# Patient Record
Sex: Male | Born: 1975
Health system: Southern US, Community
[De-identification: ages and names within clinical notes are randomized; demographics above are authoritative.]

---

## 1998-01-21 ENCOUNTER — Emergency Department (HOSPITAL_COMMUNITY): Admission: EM | Admit: 1998-01-21 | Discharge: 1998-01-21 | Payer: Self-pay | Admitting: Emergency Medicine

## 1998-10-29 ENCOUNTER — Emergency Department (HOSPITAL_COMMUNITY): Admission: EM | Admit: 1998-10-29 | Discharge: 1998-10-29 | Payer: Self-pay | Admitting: Emergency Medicine

## 1998-10-30 ENCOUNTER — Encounter: Payer: Self-pay | Admitting: Emergency Medicine

## 1999-05-09 ENCOUNTER — Encounter: Payer: Self-pay | Admitting: Emergency Medicine

## 1999-05-09 ENCOUNTER — Emergency Department (HOSPITAL_COMMUNITY): Admission: EM | Admit: 1999-05-09 | Discharge: 1999-05-10 | Payer: Self-pay | Admitting: Emergency Medicine

## 1999-09-01 ENCOUNTER — Emergency Department (HOSPITAL_COMMUNITY): Admission: EM | Admit: 1999-09-01 | Discharge: 1999-09-01 | Payer: Self-pay | Admitting: *Deleted

## 2006-09-18 ENCOUNTER — Emergency Department (HOSPITAL_COMMUNITY): Admission: EM | Admit: 2006-09-18 | Discharge: 2006-09-18 | Payer: Self-pay | Admitting: Emergency Medicine

## 2007-07-19 ENCOUNTER — Emergency Department (HOSPITAL_COMMUNITY): Admission: EM | Admit: 2007-07-19 | Discharge: 2007-07-19 | Payer: Self-pay | Admitting: Emergency Medicine

## 2007-12-28 ENCOUNTER — Emergency Department (HOSPITAL_BASED_OUTPATIENT_CLINIC_OR_DEPARTMENT_OTHER): Admission: EM | Admit: 2007-12-28 | Discharge: 2007-12-28 | Payer: Self-pay | Admitting: Emergency Medicine

## 2008-05-25 ENCOUNTER — Emergency Department (HOSPITAL_BASED_OUTPATIENT_CLINIC_OR_DEPARTMENT_OTHER): Admission: EM | Admit: 2008-05-25 | Discharge: 2008-05-25 | Payer: Self-pay | Admitting: Emergency Medicine

## 2008-05-25 ENCOUNTER — Ambulatory Visit: Payer: Self-pay | Admitting: Diagnostic Radiology

## 2008-06-21 ENCOUNTER — Emergency Department (HOSPITAL_BASED_OUTPATIENT_CLINIC_OR_DEPARTMENT_OTHER): Admission: EM | Admit: 2008-06-21 | Discharge: 2008-06-22 | Payer: Self-pay | Admitting: Emergency Medicine

## 2008-12-27 ENCOUNTER — Emergency Department (HOSPITAL_BASED_OUTPATIENT_CLINIC_OR_DEPARTMENT_OTHER): Admission: EM | Admit: 2008-12-27 | Discharge: 2008-12-27 | Payer: Self-pay | Admitting: Emergency Medicine

## 2009-02-12 ENCOUNTER — Emergency Department (HOSPITAL_BASED_OUTPATIENT_CLINIC_OR_DEPARTMENT_OTHER): Admission: EM | Admit: 2009-02-12 | Discharge: 2009-02-12 | Payer: Self-pay | Admitting: Emergency Medicine

## 2009-06-11 ENCOUNTER — Emergency Department (HOSPITAL_BASED_OUTPATIENT_CLINIC_OR_DEPARTMENT_OTHER): Admission: EM | Admit: 2009-06-11 | Discharge: 2009-06-12 | Payer: Self-pay | Admitting: Emergency Medicine

## 2009-06-12 ENCOUNTER — Ambulatory Visit: Payer: Self-pay | Admitting: Diagnostic Radiology

## 2010-01-18 ENCOUNTER — Emergency Department (HOSPITAL_BASED_OUTPATIENT_CLINIC_OR_DEPARTMENT_OTHER): Admission: EM | Admit: 2010-01-18 | Discharge: 2010-01-18 | Payer: Self-pay | Admitting: Emergency Medicine

## 2010-01-20 ENCOUNTER — Emergency Department (HOSPITAL_BASED_OUTPATIENT_CLINIC_OR_DEPARTMENT_OTHER): Admission: EM | Admit: 2010-01-20 | Discharge: 2010-01-20 | Payer: Self-pay | Admitting: Emergency Medicine

## 2010-04-09 ENCOUNTER — Emergency Department (HOSPITAL_BASED_OUTPATIENT_CLINIC_OR_DEPARTMENT_OTHER): Admission: EM | Admit: 2010-04-09 | Discharge: 2010-04-09 | Payer: Self-pay | Admitting: Emergency Medicine

## 2010-08-03 LAB — GC/CHLAMYDIA PROBE AMP, GENITAL
Chlamydia, DNA Probe: NEGATIVE
GC Probe Amp, Genital: NEGATIVE

## 2010-08-08 LAB — DIFFERENTIAL
Basophils Absolute: 0 10*3/uL (ref 0.0–0.1)
Basophils Relative: 1 % (ref 0–1)
Eosinophils Relative: 6 % — ABNORMAL HIGH (ref 0–5)
Lymphocytes Relative: 38 % (ref 12–46)

## 2010-08-08 LAB — CBC
HCT: 44.9 % (ref 39.0–52.0)
MCHC: 33.6 g/dL (ref 30.0–36.0)
Platelets: 243 10*3/uL (ref 150–400)
RDW: 11.9 % (ref 11.5–15.5)

## 2010-08-08 LAB — SEDIMENTATION RATE: Sed Rate: 9 mm/hr (ref 0–16)

## 2011-02-11 LAB — RAPID STREP SCREEN (MED CTR MEBANE ONLY): Streptococcus, Group A Screen (Direct): POSITIVE — AB

## 2012-03-09 ENCOUNTER — Encounter (HOSPITAL_BASED_OUTPATIENT_CLINIC_OR_DEPARTMENT_OTHER): Payer: Self-pay | Admitting: *Deleted

## 2012-03-09 ENCOUNTER — Emergency Department (HOSPITAL_BASED_OUTPATIENT_CLINIC_OR_DEPARTMENT_OTHER)
Admission: EM | Admit: 2012-03-09 | Discharge: 2012-03-09 | Disposition: A | Discharge status: Home / Self Care | Payer: Self-pay | Attending: Emergency Medicine | Admitting: Emergency Medicine

## 2012-03-09 DIAGNOSIS — H538 Other visual disturbances: Secondary | ICD-10-CM

## 2012-03-09 DIAGNOSIS — H16139 Photokeratitis, unspecified eye: Secondary | ICD-10-CM | POA: Insufficient documentation

## 2012-03-09 DIAGNOSIS — F17200 Nicotine dependence, unspecified, uncomplicated: Secondary | ICD-10-CM | POA: Insufficient documentation

## 2012-03-09 DIAGNOSIS — H5710 Ocular pain, unspecified eye: Secondary | ICD-10-CM

## 2012-03-09 MED ORDER — ERYTHROMYCIN 5 MG/GM OP OINT: TOPICAL_OINTMENT | OPHTHALMIC | Status: DC

## 2012-03-09 MED ORDER — FLUORESCEIN SODIUM 1 MG OP STRP
1.0000 | ORAL_STRIP | Freq: Once | OPHTHALMIC | Status: AC
  Administered 2012-03-09: 1 via OPHTHALMIC
  Filled 2012-03-09: qty 1

## 2012-03-09 MED ORDER — PROPARACAINE HCL 0.5 % OP SOLN
1.0000 [drp] | Freq: Once | OPHTHALMIC | Status: AC
  Administered 2012-03-09: 1 [drp] via OPHTHALMIC
  Filled 2012-03-09: qty 15

## 2012-03-09 NOTE — ED Provider Notes (Signed)
History     CSN: [id number]  Arrival date & time 03/09/12  4098   First MD Initiated Contact with Patient 03/09/12 1023      Chief Complaint  Patient presents with  . Eye Pain    (Consider location/radiation/quality/duration/timing/severity/associated sxs/prior treatment) HPISpencer A Hull is a 36 y.o. 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 He was working on her machine yesterday and applies a labile with ultraviolet light he said he had some difficulty with the safety devices. He said he was unable to fully shield his eyes and got flash a couple times with the UV light. He started having eye pain yesterday but it is worsened today, it is bilateral, it is worse in the right eye. He says his vision is slightly blurry, he says his eyes are tearing up and he feels like there something in his eyes. He tells me "I think it's welders eye." Denies any fevers, chills, headaches, does not have corrective vision. History reviewed. No pertinent past medical history.  History reviewed. No pertinent past surgical history.  History reviewed. No pertinent family history.  History  Substance Use Topics  . Smoking status: Current Every Day Smoker  . Smokeless tobacco: Never Used  . Alcohol Use: Yes     occ      Review of Systems At least 10pt or greater review of systems completed and are negative except where specified in the HPI.  Allergies  Review of patient's allergies indicates no known allergies.  Home Medications  No current outpatient prescriptions on file.  BP 143/88  Pulse 88  Temp 98.5 F (36.9 C) (Oral)  Resp 20  SpO2 99%  Physical Exam  Nursing notes reviewed.  Electronic medical record reviewed. VITAL SIGNS:   Filed Vitals:   03/09/12 1008  BP: 143/88  Pulse: 88  Temp: 98.5 F (36.9 C)  TempSrc: Oral  Resp: 20  SpO2: 99%   CONSTITUTIONAL: Awake, oriented, appears non-toxic HENT: Atraumatic, normocephalic, oral mucosa  pink and moist, airway patent. Nares patent without drainage. External ears normal. EYES: Ocular Exam General appearance:hazy, mildly injected conjunctiva, eyes squinting in ambient light Visual acuity OD: 20/50, OS:20/50, OU:20/30 No visual field deficits, EOMI, PERRLA 5mm Lids/Lashes:normal Anterior Chamber: No cell/flare, no hyphema, no hypopyon. No pain to iris contraction. Iris appears normal. Fluoroscein exam with cobalt blue filter reveals diffuse, punctate uptake throughout the cornea. NECK: Trachea midline, non-tender, supple CARDIOVASCULAR: Normal heart rate, Normal rhythm, No murmurs, rubs, gallops PULMONARY/CHEST: Clear to auscultation, no rhonchi, wheezes, or rales. Symmetrical breath sounds. Non-tender. ABDOMINAL: Non-distended, soft, non-tender - no rebound or guarding.  BS normal. NEUROLOGIC: Non-focal, moving all four extremities, no gross sensory or motor deficits. EXTREMITIES: No clubbing, cyanosis, or edema SKIN: Warm, Dry, No erythema, No rash  ED Course  Procedures (including critical care time)  Labs Reviewed - No data to display No results found.   1. UV keratitis   2. Eye pain   3. Blurry vision, bilateral       MDM  Jason Hull is a 36 y.o. male patient's pain was relieved by a drop of proparacaine bilaterally. On slit-lamp examination, patient has diffuse punctate keratitis likely result from the UV exposure yesterday. We'll prescribe the patient erythromycin ointment applied 4 times a day for comfort and to avoid bacterial superinfection. Patient to followup with ophthalmology as needed.  I explained the diagnosis and have given explicit precautions to return to the ER including worsening  eye pain, vision changes or any other new or worsening symptoms. The patient understands and accepts the medical plan as it's been dictated and I have answered their questions. Discharge instructions concerning home care and prescriptions have been given.  The patient  is STABLE and is discharged to home in good condition.       Jones Skene, MD 03/09/12 1543

## 2012-03-09 NOTE — ED Notes (Signed)
Bilateral eye pain and burning states the right one is worse. 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

## 2013-03-24 ENCOUNTER — Emergency Department (HOSPITAL_BASED_OUTPATIENT_CLINIC_OR_DEPARTMENT_OTHER): Payer: Self-pay

## 2013-03-24 ENCOUNTER — Emergency Department (HOSPITAL_BASED_OUTPATIENT_CLINIC_OR_DEPARTMENT_OTHER)
Admission: EM | Admit: 2013-03-24 | Discharge: 2013-03-24 | Disposition: A | Discharge status: Home / Self Care | Payer: Self-pay | Attending: Emergency Medicine | Admitting: Emergency Medicine

## 2013-03-24 ENCOUNTER — Encounter (HOSPITAL_BASED_OUTPATIENT_CLINIC_OR_DEPARTMENT_OTHER): Payer: Self-pay | Admitting: Emergency Medicine

## 2013-03-24 DIAGNOSIS — Z792 Long term (current) use of antibiotics: Secondary | ICD-10-CM | POA: Insufficient documentation

## 2013-03-24 DIAGNOSIS — K219 Gastro-esophageal reflux disease without esophagitis: Secondary | ICD-10-CM | POA: Insufficient documentation

## 2013-03-24 DIAGNOSIS — F17200 Nicotine dependence, unspecified, uncomplicated: Secondary | ICD-10-CM | POA: Insufficient documentation

## 2013-03-24 DIAGNOSIS — J159 Unspecified bacterial pneumonia: Secondary | ICD-10-CM | POA: Insufficient documentation

## 2013-03-24 DIAGNOSIS — J189 Pneumonia, unspecified organism: Secondary | ICD-10-CM

## 2013-03-24 MED ORDER — AZITHROMYCIN 250 MG PO TABS
500.0000 mg | ORAL_TABLET | Freq: Once | ORAL | Status: AC
  Administered 2013-03-24: 500 mg via ORAL
  Filled 2013-03-24: qty 2

## 2013-03-24 MED ORDER — CEFTRIAXONE SODIUM 1 G IJ SOLR
1.0000 g | Freq: Once | INTRAMUSCULAR | Status: AC
  Administered 2013-03-24: 1 g via INTRAMUSCULAR
  Filled 2013-03-24: qty 10

## 2013-03-24 MED ORDER — AZITHROMYCIN 250 MG PO TABS: 250.0000 mg | ORAL_TABLET | Freq: Every day | ORAL | Status: DC

## 2013-03-24 MED ORDER — OMEPRAZOLE 20 MG PO CPDR: 20.0000 mg | DELAYED_RELEASE_CAPSULE | Freq: Every day | ORAL | Status: DC

## 2013-03-24 MED ORDER — LIDOCAINE HCL (PF) 1 % IJ SOLN
INTRAMUSCULAR | Status: AC
  Administered 2013-03-24: 5 mL
  Filled 2013-03-24: qty 5

## 2013-03-24 NOTE — ED Provider Notes (Signed)
Medical screening examination/treatment/procedure(s) were performed by non-physician practitioner and as supervising physician I was immediately available for consultation/collaboration.  EKG Interpretation   None        Ethelda Chick, MD 03/24/13 2348

## 2013-03-24 NOTE — ED Notes (Addendum)
Pt reports body aches, cough, fever/chills, headache, starting about 4 days ago. Took theraflu last night but nothing today. Also c/o heartburn "all day, every day, even wakes me up out of my sleep" for over a year. Never evaluated.

## 2013-03-24 NOTE — ED Provider Notes (Signed)
CSN: [id number]     Arrival date & time 03/24/13  1754 History   First MD Initiated Contact with Patient 03/24/13 1838     Chief Complaint  Patient presents with  . Cough  . Fever   (Consider location/radiation/quality/duration/timing/severity/associated sxs/prior Treatment) Patient is a 37 y.o. male presenting with cough. The history is provided by the patient. No language interpreter was used.  Cough Cough characteristics:  Productive Sputum characteristics:  Nondescript Severity:  Moderate Onset quality:  Gradual Timing:  Constant Progression:  Worsening Chronicity:  New Smoker: no   Relieved by:  Nothing Ineffective treatments:  None tried Associated symptoms: fever     History reviewed. No pertinent past medical history. History reviewed. No pertinent past surgical history. History reviewed. No pertinent family history. History  Substance Use Topics  . Smoking status: Current Every Day Smoker -- 0.25 packs/day    Types: Cigarettes  . Smokeless tobacco: Never Used  . Alcohol Use: Yes     Comment: occ    Review of Systems  Constitutional: Positive for fever.  Respiratory: Positive for cough.   All other systems reviewed and are negative.    Allergies  Review of patient's allergies indicates no known allergies.  Home Medications   Current Outpatient Rx  Name  Route  Sig  Dispense  Refill  . erythromycin ophthalmic ointment      Place a 1/2 inch ribbon of ointment into the lower eyelid of both eyes 4 times daily.   1 g   0    BP 136/98  Pulse 90  Temp(Src) 99.6 F (37.6 C) (Oral)  Resp 16  Ht 5\' 11"  (1.803 m)  Wt 300 lb (136.079 kg)  BMI 41.86 kg/m2  SpO2 97% Physical Exam  Nursing note and vitals reviewed. Constitutional: He is oriented to person, place, and time. He appears well-developed and well-nourished.  HENT:  Head: Normocephalic.  Right Ear: External ear normal.  Left Ear: External ear normal.  Nose: Nose normal.  Mouth/Throat:  Oropharynx is clear and moist.  Eyes: Conjunctivae are normal. Pupils are equal, round, and reactive to light.  Neck: Normal range of motion. Neck supple.  Cardiovascular: Normal rate, regular rhythm and normal heart sounds.   Pulmonary/Chest: Effort normal and breath sounds normal.  Abdominal: Soft.  Musculoskeletal: Normal range of motion.  Neurological: He is alert and oriented to person, place, and time. He has normal reflexes.  Skin: Skin is warm.  Psychiatric: He has a normal mood and affect.   I counseled pt on reflux management.  Pt has been having reflux for the past year ED Course  Procedures (including critical care time) Labs Review Labs Reviewed - No data to display Imaging Review No results found.  EKG Interpretation   None      Pt is Jason Hull's son.  MDM   1. Community acquired pneumonia   2. Reflux    Pt given rocephin IM,  zithromax po.   Pt given rx for zithromax and prilosec.    Lonia Skinner Friona, PA-C 03/24/13 [phone]

## 2014-03-24 ENCOUNTER — Emergency Department (HOSPITAL_BASED_OUTPATIENT_CLINIC_OR_DEPARTMENT_OTHER)
Admission: EM | Admit: 2014-03-24 | Discharge: 2014-03-24 | Disposition: A | Discharge status: Home / Self Care | Payer: Self-pay | Attending: Emergency Medicine | Admitting: Emergency Medicine

## 2014-03-24 ENCOUNTER — Encounter (HOSPITAL_BASED_OUTPATIENT_CLINIC_OR_DEPARTMENT_OTHER): Payer: Self-pay | Admitting: Emergency Medicine

## 2014-03-24 ENCOUNTER — Emergency Department (HOSPITAL_BASED_OUTPATIENT_CLINIC_OR_DEPARTMENT_OTHER): Payer: Self-pay

## 2014-03-24 DIAGNOSIS — Z792 Long term (current) use of antibiotics: Secondary | ICD-10-CM | POA: Insufficient documentation

## 2014-03-24 DIAGNOSIS — B353 Tinea pedis: Secondary | ICD-10-CM | POA: Insufficient documentation

## 2014-03-24 DIAGNOSIS — Z720 Tobacco use: Secondary | ICD-10-CM | POA: Insufficient documentation

## 2014-03-24 MED ORDER — CLOTRIMAZOLE 1 % EX CREA: TOPICAL_CREAM | CUTANEOUS | Status: AC

## 2014-03-24 NOTE — ED Provider Notes (Signed)
CSN: [id number]     Arrival date & time 03/24/14  0908 History   First MD Initiated Contact with Patient 03/24/14 0912     Chief Complaint  Patient presents with  . Foot Pain     (Consider location/radiation/quality/duration/timing/severity/associated sxs/prior Treatment) HPI Comments: Pt states that he has been having left fifth toe pain times 10 days. Swelling for the last 2 days. Denies being diabetic. States that it hurts to but in a shoe. Denies drainage or redness  The history is provided by the patient. No language interpreter was used.    History reviewed. No pertinent past medical history. History reviewed. No pertinent past surgical history. No family history on file. History  Substance Use Topics  . Smoking status: Current Every Day Smoker -- 0.25 packs/day    Types: Cigarettes  . Smokeless tobacco: Never Used  . Alcohol Use: Yes     Comment: occ    Review of Systems  All other systems reviewed and are negative.     Allergies  Review of patient's allergies indicates no known allergies.  Home Medications   Prior to Admission medications   Medication Sig Start Date End Date Taking? Authorizing Provider  azithromycin (ZITHROMAX) 250 MG tablet Take 1 tablet (250 mg total) by mouth daily. Take first 2 tablets together, then 1 every day until finished. 03/24/13   Elson AreasLeslie K Sofia, PA-C  erythromycin ophthalmic ointment Place a 1/2 inch ribbon of ointment into the lower eyelid of both eyes 4 times daily. 03/09/12   John-Adam Bonk, MD  omeprazole (PRILOSEC) 20 MG capsule Take 1 capsule (20 mg total) by mouth daily. 03/24/13   Lonia SkinnerLeslie K Sofia, PA-C   BP 145/85 mmHg  Pulse 73  Temp(Src) 98.5 F (36.9 C) (Oral)  Resp 16  Ht 6' (1.829 m)  Wt 290 lb (131.543 kg)  BMI 39.32 kg/m2  SpO2 97% Physical Exam  Constitutional: He is oriented to person, place, and time. He appears well-developed and well-nourished.  Cardiovascular: Normal rate and regular rhythm.    Pulmonary/Chest: Effort normal and breath sounds normal.  Musculoskeletal: Normal range of motion.  Neurological: He is alert and oriented to person, place, and time.  Skin:  White draining area to between left fourth and fifth toe  Nursing note and vitals reviewed.   ED Course  Procedures (including critical care time) Labs Review Labs Reviewed - No data to display  Imaging Review Dg Foot Complete Left  03/24/2014   CLINICAL DATA:  One week history of atraumatic lateral foot pain and swelling  EXAM: LEFT FOOT - COMPLETE 3+ VIEW  COMPARISON:  None.  FINDINGS: The bones of the left foot are adequately mineralized. There is no acute fracture nor dislocation. There is no periosteal reaction or lytic or blastic lesion. There is a tiny plantar calcaneal spur. There may be mild soft tissue swelling dorso laterally. There is no soft tissue gas or calcification.  IMPRESSION: There is no acute or significant chronic bony abnormality of the left foot.   Electronically Signed   By: David  Swazil[place]   On: 03/24/2014 10:11     EKG Interpretation None      MDM   Final diagnoses:  Athlete's foot on left   Exam consistent with athletes foot. No infection noted. Discussed care at home    Teressa LowerVrinda Melvin Marmo, NP 03/24/14 1020

## 2014-03-24 NOTE — ED Notes (Signed)
C/o left foot pain X10 days with swelling to left 5th toe X2 days, no fever or other complaints, A/OX4, ambulatory and in NAD

## 2014-03-24 NOTE — Discharge Instructions (Signed)
Athlete's Foot  Athlete's foot is a skin infection caused by a fungus. Athlete's foot is often seen between or under the toes. It can also be seen on the bottom of the foot. Athlete's foot can spread to other people by sharing towels or shower stalls. HOME CARE  Only take medicines as told by your doctor. Do not use steroid creams.  Wash your feet daily. Dry your feet well, especially between the toes.  Change your socks every day. Wear cotton or wool socks.  Change your socks 2 to 3 times a day in hot weather.  Wear sandals or canvas tennis shoes with good airflow.  If you have blisters, soak your feet in a solution as told by your doctor. Do this for 20 to 30 minutes, 2 times a day. Dry your feet well after you soak them.  Do not share towels.  Wear sandals when you use shared locker rooms or showers. GET HELP RIGHT AWAY IF:   You have a fever.  Your foot is puffy (swollen), sore, warm, or red.  You are not getting better after 7 days of treatment.  You still have athlete's foot after 30 days.  You have problems caused by your medicine. MAKE SURE YOU:   Understand these instructions.  Will watch your condition.  Will get help right away if you are not doing well or get worse. Document Released: 10/26/2007 Document Revised: 08/01/2011 Document Reviewed: 02/25/2011 Exit[employer] Patient Information 2015 [employer]. This information is not intended to replace advice given to you by your health care provider. Make sure you discuss any questions you have with your health care provider.  

## 2014-03-24 NOTE — ED Notes (Signed)
Patient transported to X-ray 

## 2014-03-26 ENCOUNTER — Emergency Department (HOSPITAL_BASED_OUTPATIENT_CLINIC_OR_DEPARTMENT_OTHER)
Admission: EM | Admit: 2014-03-26 | Discharge: 2014-03-26 | Disposition: A | Discharge status: Home / Self Care | Payer: Self-pay | Attending: Emergency Medicine | Admitting: Emergency Medicine

## 2014-03-26 ENCOUNTER — Encounter (HOSPITAL_BASED_OUTPATIENT_CLINIC_OR_DEPARTMENT_OTHER): Payer: Self-pay | Admitting: *Deleted

## 2014-03-26 DIAGNOSIS — Z792 Long term (current) use of antibiotics: Secondary | ICD-10-CM | POA: Insufficient documentation

## 2014-03-26 DIAGNOSIS — Z79899 Other long term (current) drug therapy: Secondary | ICD-10-CM | POA: Insufficient documentation

## 2014-03-26 DIAGNOSIS — B353 Tinea pedis: Secondary | ICD-10-CM | POA: Insufficient documentation

## 2014-03-26 DIAGNOSIS — Z720 Tobacco use: Secondary | ICD-10-CM | POA: Insufficient documentation

## 2014-03-26 DIAGNOSIS — L03116 Cellulitis of left lower limb: Secondary | ICD-10-CM | POA: Insufficient documentation

## 2014-03-26 MED ORDER — SULFAMETHOXAZOLE-TRIMETHOPRIM 800-160 MG PO TABS
1.0000 | ORAL_TABLET | Freq: Once | ORAL | Status: AC
  Administered 2014-03-26: 1 via ORAL
  Filled 2014-03-26: qty 1

## 2014-03-26 MED ORDER — TERBINAFINE HCL 1 % EX CREA: 1.0000 "application " | TOPICAL_CREAM | Freq: Two times a day (BID) | CUTANEOUS | Status: DC

## 2014-03-26 MED ORDER — SULFAMETHOXAZOLE-TRIMETHOPRIM 800-160 MG PO TABS: 1.0000 | ORAL_TABLET | Freq: Two times a day (BID) | ORAL | Status: DC

## 2014-03-26 NOTE — Discharge Instructions (Signed)
Athlete's Foot Athlete's foot (tinea pedis) is a fungal infection of the skin on the feet. It often occurs on the skin between the toes or underneath the toes. It can also occur on the soles of the feet. Athlete's foot is more likely to occur in hot, humid weather. Not washing your feet or changing your socks often enough can contribute to athlete's foot. The infection can spread from person to person (contagious). CAUSES Athlete's foot is caused by a fungus. This fungus thrives in warm, moist places. Most people get athlete's foot by sharing shower stalls, towels, and wet floors with an infected person. People with weakened immune systems, including those with diabetes, may be more likely to get athlete's foot. SYMPTOMS   Itchy areas between the toes or on the soles of the feet.  White, flaky, or scaly areas between the toes or on the soles of the feet.  Tiny, intensely itchy blisters between the toes or on the soles of the feet.  Tiny cuts on the skin. These cuts can develop a bacterial infection.  Thick or discolored toenails. DIAGNOSIS  Your caregiver can usually tell what the problem is by doing a physical exam. Your caregiver may also take a skin sample from the rash area. The skin sample may be examined under a microscope, or it may be tested to see if fungus will grow in the sample. A sample may also be taken from your toenail for testing. TREATMENT  Over-the-counter and prescription medicines can be used to kill the fungus. These medicines are available as powders or creams. Your caregiver can suggest medicines for you. Fungal infections respond slowly to treatment. You may need to continue using your medicine for several weeks. PREVENTION   Do not share towels.  Wear sandals in wet areas, such as shared locker rooms and shared showers.  Keep your feet dry. Wear shoes that allow air to circulate. Wear cotton or wool socks. HOME CARE INSTRUCTIONS   Take medicines as directed by  your caregiver. Do not use steroid creams on athlete's foot.  Keep your feet clean and cool. Wash your feet daily and dry them thoroughly, especially between your toes.  Change your socks every day. Wear cotton or wool socks. In hot climates, you may need to change your socks 2 to 3 times per day.  Wear sandals or canvas tennis shoes with good air circulation.  If you have blisters, soak your feet in Burow's solution or Epsom salts for 20 to 30 minutes, 2 times a day to dry out the blisters. Make sure you dry your feet thoroughly afterward. SEEK MEDICAL CARE IF:   You have a fever.  You have swelling, soreness, warmth, or redness in your foot.  You are not getting better after 7 days of treatment.  You are not completely cured after 30 days.  You have any problems caused by your medicines. MAKE SURE YOU:   Understand these instructions.  Will watch your condition.  Will get help right away if you are not doing well or get worse. Document Released: 05/06/2000 Document Revised: 08/01/2011 Document Reviewed: 02/25/2011 [facility] Patient Information 2015 [place], [place]. This information is not intended to replace advice given to you by your health care provider. Make sure you discuss any questions you have with your health care provider.  Cellulitis Cellulitis is an infection of the skin and the tissue beneath it. The infected area is usually red and tender. Cellulitis occurs most often in the arms and lower legs.  CAUSES  Cellulitis is caused by bacteria that enter the skin through cracks or cuts in the skin. The most common types of bacteria that cause cellulitis are staphylococci and streptococci. SIGNS AND SYMPTOMS   Redness and warmth.  Swelling.  Tenderness or pain.  Fever. DIAGNOSIS  Your health care provider can usually determine what is wrong based on a physical exam. Blood tests may also be done. TREATMENT  Treatment usually involves taking an antibiotic  medicine. HOME CARE INSTRUCTIONS   Take your antibiotic medicine as directed by your health care provider. Finish the antibiotic even if you start to feel better.  Keep the infected arm or leg elevated to reduce swelling.  Apply a warm cloth to the affected area up to 4 times per day to relieve pain.  Take medicines only as directed by your health care provider.  Keep all follow-up visits as directed by your health care provider. SEEK MEDICAL CARE IF:   You notice red streaks coming from the infected area.  Your red area gets larger or turns dark in color.  Your bone or joint underneath the infected area becomes painful after the skin has healed.  Your infection returns in the same area or another area.  You notice a swollen bump in the infected area.  You develop new symptoms.  You have a fever. SEEK IMMEDIATE MEDICAL CARE IF:   You feel very sleepy.  You develop vomiting or diarrhea.  You have a general ill feeling (malaise) with muscle aches and pains. MAKE SURE YOU:   Understand these instructions.  Will watch your condition.  Will get help right away if you are not doing well or get worse. Document Released: 02/16/2005 Document Revised: 09/23/2013 Document Reviewed: 07/25/2011 [facility] Patient Information 2015 [place]Care, [place]. This information is not intended to replace advice given to you by your health care provider. Make sure you discuss any questions you have with your health care provider.

## 2014-03-26 NOTE — ED Provider Notes (Signed)
TIME SEEN: 11:28 AM  CHIEF COMPLAINT: foot pain, swelling, erythema  HPI: Pt is a 38 y.o. M with no significant past medical history he was seen in the emergency department 2 days ago for left foot pain between his fourth and fifth toes for the past 10 days. He was diagnosed with tinea pedis and started on Lotrimin. He states that over the past day he has had erythema that has streaks up his foot and has had swelling and has been unable to put on a shoe. No fevers, chills, nausea, vomiting or diarrhea. He is not a diabetic. No history of injury to the foot. No drainage from the lesions between his toes.  ROS: See HPI Constitutional: no fever  Eyes: no drainage  ENT: no runny nose   Cardiovascular:  no chest pain  Resp: no SOB  GI: no vomiting GU: no dysuria Integumentary: no rash  Allergy: no hives  Musculoskeletal: no leg swelling  Neurological: no slurred speech ROS otherwise negative  PAST MEDICAL HISTORY/PAST SURGICAL HISTORY:  History reviewed. No pertinent past medical history.  MEDICATIONS:  Prior to Admission medications   Medication Sig Start Date End Date Taking? Authorizing Provider  clotrimazole (LOTRIMIN) 1 % cream Apply to affected area 2 times daily 03/24/14  Yes Teressa LowerVrinda Pickering, NP  azithromycin (ZITHROMAX) 250 MG tablet Take 1 tablet (250 mg total) by mouth daily. Take first 2 tablets together, then 1 every day until finished. 03/24/13   Elson AreasLeslie K Sofia, PA-C  erythromycin ophthalmic ointment Place a 1/2 inch ribbon of ointment into the lower eyelid of both eyes 4 times daily. 03/09/12   John-Adam Bonk, MD  omeprazole (PRILOSEC) 20 MG capsule Take 1 capsule (20 mg total) by mouth daily. 03/24/13   Elson AreasLeslie K Sofia, PA-C    ALLERGIES:  No Known Allergies  SOCIAL HISTORY:  History  Substance Use Topics  . Smoking status: Current Every Day Smoker -- 0.25 packs/day    Types: Cigarettes  . Smokeless tobacco: Never Used  . Alcohol Use: Yes     Comment: occ    FAMILY  HISTORY: No family history on file.  EXAM: BP 138/86 mmHg  Pulse 66  Temp(Src) 97.9 F (36.6 C) (Oral)  Resp 18  Ht 5' 11.5" (1.816 m)  Wt 290 lb (131.543 kg)  BMI 39.89 kg/m2  SpO2 97% CONSTITUTIONAL: Alert and oriented and responds appropriately to questions. Well-appearing; well-nourished HEAD: Normocephalic EYES: Conjunctivae clear, PERRL ENT: normal nose; no rhinorrhea; moist mucous membranes; pharynx without lesions noted NECK: Supple, no meningismus, no LAD  CARD: RRR; S1 and S2 appreciated; no murmurs, no clicks, no rubs, no gallops RESP: Normal chest excursion without splinting or tachypnea; breath sounds clear and equal bilaterally; no wheezes, no rhonchi, no rales,  ABD/GI: Normal bowel sounds; non-distended; soft, non-tender, no rebound, no guarding BACK:  The back appears normal and is non-tender to palpation, there is no CVA tenderness EXT: Normal ROM in all joints; non-tender to palpation; mild non-pitting edema of the left foot; normal capillary refill; no cyanosis    SKIN: Normal color for age and race; warm; white appearing skin between 4th and fifth toes on the left side without drainage, there is erythema and tenderness to the dorsal aspect of the right foot to the mid foot without induration or fluctuance, no joint effusion, 2+ DP pulses bilaterally, sensation to light touch intact diffusely NEURO: Moves all extremities equally PSYCH: The patient's mood and manner are appropriate. Grooming and personal hygiene are appropriate.  MEDICAL DECISION MAKING: Pt here with tinea pedis but has a superimposed cellulitis. We'll discharge home on Bactrim. He is not having systemic symptoms is hemodynamically stable. He is not immunocompromised and is not a diabetic. We'll give him a postop shoe given he reports he is unable to put a shoe on given his swelling.  Offered crutches but he denies. Discussed return precautions and supportive care instructions including ice, elevation.  He has ibuprofen to take for pain. He verbalizes understanding and is comfortable with plan.       Jason MawKristen N Ward, DO 03/26/14 1549

## 2014-03-26 NOTE — ED Notes (Signed)
States he was here prior and dx with athetes foot. Pt has red area to left foot and swelling noted. 2+ pedal NL cap refill.

## 2015-05-25 ENCOUNTER — Encounter (HOSPITAL_BASED_OUTPATIENT_CLINIC_OR_DEPARTMENT_OTHER): Payer: Self-pay | Admitting: *Deleted

## 2015-05-25 ENCOUNTER — Emergency Department (HOSPITAL_BASED_OUTPATIENT_CLINIC_OR_DEPARTMENT_OTHER): Payer: Self-pay

## 2015-05-25 ENCOUNTER — Emergency Department (HOSPITAL_BASED_OUTPATIENT_CLINIC_OR_DEPARTMENT_OTHER)
Admission: EM | Admit: 2015-05-25 | Discharge: 2015-05-25 | Disposition: A | Discharge status: Home / Self Care | Payer: Self-pay | Attending: Emergency Medicine | Admitting: Emergency Medicine

## 2015-05-25 DIAGNOSIS — Z79899 Other long term (current) drug therapy: Secondary | ICD-10-CM | POA: Insufficient documentation

## 2015-05-25 DIAGNOSIS — Y9389 Activity, other specified: Secondary | ICD-10-CM | POA: Insufficient documentation

## 2015-05-25 DIAGNOSIS — S46912A Strain of unspecified muscle, fascia and tendon at shoulder and upper arm level, left arm, initial encounter: Secondary | ICD-10-CM | POA: Insufficient documentation

## 2015-05-25 DIAGNOSIS — Y9289 Other specified places as the place of occurrence of the external cause: Secondary | ICD-10-CM | POA: Insufficient documentation

## 2015-05-25 DIAGNOSIS — Z87891 Personal history of nicotine dependence: Secondary | ICD-10-CM | POA: Insufficient documentation

## 2015-05-25 DIAGNOSIS — X58XXXA Exposure to other specified factors, initial encounter: Secondary | ICD-10-CM | POA: Insufficient documentation

## 2015-05-25 DIAGNOSIS — Y998 Other external cause status: Secondary | ICD-10-CM | POA: Insufficient documentation

## 2015-05-25 DIAGNOSIS — S29001A Unspecified injury of muscle and tendon of front wall of thorax, initial encounter: Secondary | ICD-10-CM | POA: Insufficient documentation

## 2015-05-25 MED ORDER — KETOROLAC TROMETHAMINE 30 MG/ML IJ SOLN
30.0000 mg | Freq: Once | INTRAMUSCULAR | Status: DC
  Administered 2015-05-25: 30 mg via INTRAVENOUS
  Filled 2015-05-25: qty 1

## 2015-05-25 MED ORDER — METHOCARBAMOL 500 MG PO TABS: 1000.0000 mg | ORAL_TABLET | Freq: Four times a day (QID) | ORAL | Status: AC | PRN

## 2015-05-25 MED ORDER — KETOROLAC TROMETHAMINE 60 MG/2ML IM SOLN: 30.0000 mg | Freq: Once | INTRAMUSCULAR | Status: AC

## 2015-05-25 MED FILL — METHOCARBAMOL 500 MG TABLET: 500 | 2 days supply | Qty: 20 | Fill #0

## 2015-05-25 NOTE — ED Provider Notes (Signed)
CSN: [id number]     Arrival date & time 05/25/15  1043 History   First MD Initiated Contact with Patient 05/25/15 1050     (Consider location/radiation/quality/duration/timing/severity/associated sxs/prior Treatment) HPI  Blood pressure 132/97, pulse 58, temperature 97.8 F (36.6 C), temperature source Oral, resp. rate 18, height 5' 11.5" (1.816 m), weight 128.822 kg, SpO2 97 %.  Jason Hull is a 40 y.o. male complaining of severe pain to left chest and shoulder onset 2 days ago when he was opening a door. Patient has been taking Motrin and Tylenol at home with little relief. Patient states he only has pain when he goes to move the left shoulder. He denies cough, shortness of breath, palpitations, syncope, exertional exacerbation of pain, diaphoresis, family history of HTN, diabetes, hypertension, hyperlipidemia. He quit smoking 2 days ago.  History reviewed. No pertinent past medical history. History reviewed. No pertinent past surgical history. No family history on file. Social History  Substance Use Topics  . Smoking status: Former Smoker -- 0.25 packs/day    Types: Cigarettes    Quit date: 05/23/2015  . Smokeless tobacco: Never Used  . Alcohol Use: Yes     Comment: occ    Review of Systems  10 systems reviewed and found to be negative, except as noted in the HPI.   Allergies  Review of patient's allergies indicates no known allergies.  Home Medications   Prior to Admission medications   Medication Sig Start Date End Date Taking? Authorizing Provider  clotrimazole (LOTRIMIN) 1 % cream Apply to affected area 2 times daily 03/24/14  Yes Teressa LowerVrinda Pickering, NP  methocarbamol (ROBAXIN) 500 MG tablet Take 2 tablets (1,000 mg total) by mouth 4 (four) times daily as needed (Pain). 05/25/15   Tesha Archambeau, PA-C   BP 132/97 mmHg  Pulse 58  Temp(Src) 97.8 F (36.6 C) (Oral)  Resp 18  Ht 5' 11.5" (1.816 m)  Wt 128.822 kg  BMI 39.06 kg/m2  SpO2 97% Physical Exam   Constitutional: He is oriented to person, place, and time. He appears well-developed and well-nourished. No distress.  HENT:  Head: Normocephalic and atraumatic.  Mouth/Throat: Oropharynx is clear and moist.  Eyes: Conjunctivae and EOM are normal. Pupils are equal, round, and reactive to light.  Neck: Normal range of motion.  Cardiovascular: Normal rate, regular rhythm and intact distal pulses.   Pulmonary/Chest: Effort normal and breath sounds normal. He exhibits no tenderness.  Abdominal: Soft. There is no tenderness.  Musculoskeletal: Normal range of motion. He exhibits no edema or tenderness.  Left shoulder: Shoulder with no deformity. FROM to shoulder and elbow. No TTP of rotator cuff musculature. Drop arm negative. Neurovascularly intact   Neurological: He is alert and oriented to person, place, and time.  Skin: He is not diaphoretic.  Psychiatric: He has a normal mood and affect.  Nursing note and vitals reviewed.   ED Course  Procedures (including critical care time) Labs Review Labs Reviewed - No data to display  Imaging Review Dg Chest 2 View  05/25/2015  CLINICAL DATA:  Patient with left-sided chest pain for 2 days. EXAM: CHEST  2 VIEW COMPARISON:  Chest radiograph 03/24/2013. FINDINGS: Stable cardiac and mediastinal contours. No consolidative pulmonary opacities. No pleural effusion or pneumothorax. Regional skeleton is unremarkable. IMPRESSION: No active cardiopulmonary disease. Electronically Signed   By: Annia Beltrew  Davis M.D.   On: 05/25/2015 11:34   I have personally reviewed and evaluated these images and lab results as part of my medical decision-making.  EKG Interpretation None      MDM   Final diagnoses:  Left shoulder strain, initial encounter    Filed Vitals:   05/25/15 1045  BP: 132/97  Pulse: 58  Temp: 97.8 F (36.6 C)  TempSrc: Oral  Resp: 18  Height: 5' 11.5" (1.816 m)  Weight: 128.822 kg  SpO2: 97%    Medications  ketorolac (TORADOL)  injection 30 mg (0 mg Intramuscular Duplicate 05/25/15 1134)    [name] is 40 y.o. male presenting with positional left chest and left shoulder pain. Patient only has pain with movement. This is clearly musculoskeletal, do not think this is originating from an ACS. Discussed with attending physician who would like to obtain chest x-ray to evaluate for possible pneumothorax. Chest x-ray negative. Patient is given a sling, muscle relaxers and sports medicine referral.  Evaluation does not show pathology that would require ongoing emergent intervention or inpatient treatment. Pt is hemodynamically stable and mentating appropriately. Discussed findings and plan with patient/guardian, who agrees with care plan. All questions answered. Return precautions discussed and outpatient follow up given.   New Prescriptions   METHOCARBAMOL (ROBAXIN) 500 MG TABLET    Take 2 tablets (1,000 mg total) by mouth 4 (four) times daily as needed (Pain).         Wynetta Emery, PA-C 05/25/15 1142  Rolan Bucco, MD 05/25/15 1450

## 2015-05-25 NOTE — Discharge Instructions (Signed)
Only use the arm sling for up to 2 days. Take the arm out and rotate the shoulder every 4 hours.   For pain control you may take up to 800mg  of Motrin (also known as ibuprofen). That is usually 4 over the counter pills,  3 times a day. Take with food to minimize stomach irritation   You can also take  tylenol (acetaminophen) 975mg  (this is 3 over the counter pills) four times a day. Do not drink alcohol or combine with other medications that have acetaminophen as an ingredient (Read the labels!).    For breakthrough pain you may take Robaxin. Do not drink alcohol, drive or operate heavy machinery when taking Robaxin.  Do not hesitate to return to the emergency room for any new, worsening or concerning symptoms.  Please obtain primary care using resource guide below. Let them know that you were seen in the emergency room and that they will need to obtain records for further outpatient management.   Muscle Strain A muscle strain (pulled muscle) happens when a muscle is stretched beyond normal length. It happens when a sudden, violent force stretches your muscle too far. Usually, a few of the fibers in your muscle are torn. Muscle strain is common in athletes. Recovery usually takes 1-2 weeks. Complete healing takes 5-6 weeks.  HOME CARE   Follow the PRICE method of treatment to help your injury get better. Do this the first 2-3 days after the injury:  Protect. Protect the muscle to keep it from getting injured again.  Rest. Limit your activity and rest the injured body part.  Ice. Put ice in a plastic bag. Place a towel between your skin and the bag. Then, apply the ice and leave it on from 15-20 minutes each hour. After the third day, switch to moist heat packs.  Compression. Use a splint or elastic bandage on the injured area for comfort. Do not put it on too tightly.  Elevate. Keep the injured body part above the level of your heart.  Only take medicine as told by your doctor.  Warm  up before doing exercise to prevent future muscle strains. GET HELP IF:   You have more pain or puffiness (swelling) in the injured area.  You feel numbness, tingling, or notice a loss of strength in the injured area. MAKE SURE YOU:   Understand these instructions.  Will watch your condition.  Will get help right away if you are not doing well or get worse.   This information is not intended to replace advice given to you by your health care provider. Make sure you discuss any questions you have with your health care provider.   Document Released: 02/16/2008 Document Revised: 02/27/2013 Document Reviewed: 12/06/2012 Elsevier Interactive Patient Education 2016 [employer].   Emergency Department Resource Guide 1) Find a Doctor and Pay Out of Pocket Although you won't have to find out who is covered by your insurance plan, it is a good idea to ask around and get recommendations. You will then need to call the office and see if the doctor you have chosen will accept you as a new patient and what types of options they offer for patients who are self-pay. Some doctors offer discounts or will set up payment plans for their patients who do not have insurance, but you will need to ask so you aren't surprised when you get to your appointment.  2) Contact Your Local Health Department Not all health departments have doctors that can  see patients for sick visits, but many do, so it is worth a call to see if yours does. If you don't know where your local health department is, you can check in your phone book. The CDC also has a tool to help you locate your state's health department, and many state websites also have listings of all of their local health departments.  3) Find a Walk-in Clinic If your illness is not likely to be very severe or complicated, you may want to try a walk in clinic. These are popping up all over the country in pharmacies, drugstores, and shopping centers. They're usually  staffed by nurse practitioners or physician assistants that have been trained to treat common illnesses and complaints. They're usually fairly quick and inexpensive. However, if you have serious medical issues or chronic medical problems, these are probably not your best option.  No Primary Care Doctor: - Call Health Connect at  [phone] - they can help you locate a primary care doctor that  accepts your insurance, provides certain services, etc. - Physician Referral Service- [phone]  Chronic Pain Problems: Organization         Address  Phone   Notes  [facility]  [phone] Patients need to be referred by their primary care doctor.   Medication Assistance: Organization         Address  Phone   Notes  [facility] Medication [facility] [address] [place] [place], [place] [address] [phone] --Must be a resident of [facility] -- Must have NO insurance coverage whatsoever (no Medicaid/ Medicare, etc.) -- The pt. MUST have a primary care doctor that directs their care regularly and follows them in the community   [facility]ist  [phone]   [employer]  [phone]    Agencies that provide inexpensive medical care: Organization         Address  Phone   Notes  [facility]  [phone]   [facility]    [phone]   [facility] [address] [place], [place] [address] [phone]   [facility] [address] [place] [place] [phone]   Planned Parenthood    [phone]   [facility]    [phone]   Community Health and [facility]  [address], [place] Phone:  [phone], Fax:  [phone] Hours of Operation:  9 am - 6 pm, M-F.  Also accepts Medicaid/Medicare and self-pay.  [facility]  [address], [place] Phone: [phone], Fax: [phone]. Hours of  Operation:  8:30 am - 5:30 pm, M-F.  Also accepts Medicaid and self-pay.  [facility] [place] [address] [place] Phone: [phone]   [facility] [address] Natasha Bence [place], [place] [phone], Ext. [phone] Mondays & Thursdays: 7-9 AM.  First 15 patients are seen on a first come, first serve basis.    Medicaid-accepting [facility] Providers:  Organization         Address  Phone   Notes  [facility] [address], [place] [phone] Also accepts self-pay patients.  [facility] [address] Laurell Josephs [place], [place]  [phone]   [facility] [address], [place] [phone]   [facility] [address] [place] [address],  [place] [phone]   Renaye Rakers [address], [place]   [phone] Only accepts [place] Access [place] patients after they have their name applied to their card.   Self-Pay (no insurance) in [facility]:  Organization         Address  Phone   Notes  Sickle Cell Patients, [facility] [address] [place], [place] [phone]   [facility] [address] [place], [place] [phone]   [facility] [place]  [address], [place] [phone]   [facility]/Dr. Osei-Bonsu  [address], [place] or [address], [place] [phone] Phone number for both [place] and [place] locations is the same.  [facility] [address], [place] [phone]   [facility] [address], [place] or [address] [phone]   [facility] [address] [place] [phone], phone; [phone], fax Sees patients 1st and 3rd Saturday of every month.  Must not qualify for public or private insurance (i.e. Medicaid, Medicare,  [place] Health Choice, Veterans' Benefits)  Household income should be no more than 200% of the poverty level The clinic cannot treat you if you are pregnant or think you are pregnant  Sexually transmitted diseases are not treated at the clinic.    Dental Care: Organization         Address  Phone  Notes  [facility] of [facility] [address] [place], [place] [phone] Accepts children up to age 37 who are enrolled in [place] or [place] Health Choice; pregnant women with a Medicaid card; and children who have applied for Medicaid or [place] Health Choice, but were declined, whose parents can pay a reduced fee at time of service.  [facility] Department of [facility]  [address], [place] [phone] Accepts children up to age 68 who are enrolled in [place] or [place] Health Choice; pregnant women with a Medicaid card; and children who have applied for Medicaid or [place] Health Choice, but were declined, whose parents can pay a reduced fee at time of service.  [facility]  [address] [place], [place] [phone] Patients are seen by appointment only. Walk-ins are not accepted. [facility] will see patients 73 years of age and older. Monday - Tuesday (8am-5pm) Most Wednesdays (8:30-5pm) $30 per visit, cash only  [facility]  [address], [facility] [phone] Patients are seen by appointment only. Walk-ins are not accepted. [facility] will see patients 72 years of age and older. One Wednesday Evening (Monthly: Volunteer Based).  $30 per visit, cash only  [employer]  [phone] for adults; Children under age 40, call Graduate Pediatric Dentistry at [phone]. Children aged 1-14, please call [phone] to request a pediatric application.  Dental services are provided in all areas of dental care including fillings, crowns and bridges,  complete and partial dentures, implants, gum treatment, root canals, and extractions. Preventive care is also provided. Treatment is provided to both adults and children. Patients are selected via a lottery and there is often a waiting list.   [facility] [address], [place]  [phone] [email or web address]   [facility] [address]  [place], [place] [phone], Ext. [phone] Second and Fourth Thursday of each month, opens at 6:30 AM; Clinic ends at 9 AM.  Patients are seen on a first-come first-served basis, and a limited number are seen during each clinic.   [facility]  [address] Ether Griffins [place], [place] [phone]   Eligibility Requirements You must have lived in [place], [place], or [place] counties for at least the last three months.   You cannot be eligible for state or federal sponsored [place], including [employer], [place], or [employer].   You generally cannot be eligible for healthcare insurance through your employer.    How to apply: Eligibility screenings are held every Tuesday and Wednesday afternoon from 1:00 pm until 4:00 pm. You do not need an appointment for the interview!  [facility] [address], [place], [place] [phone]   [facility] Health Department  [phone]   [facility] Health Department  [phone]   [facility] Health Department  [phone]    Behavioral Health Resources in the Community: Intensive Outpatient Programs Organization         Address  Phone  Notes  [facility] [address] [place], [place] [phone]   [facility] Outpatient [address] [place], [place] [phone]   [facility] [address], [place], [place]  [phone]   [facility] [address]  [place], [place] [phone] or [phone]   Substance Abuse Resources Organization          Address  Phone  Notes  [facility]  [phone]   [facility]  [phone]   [facility]  [phone]   Floydene Flock  [phone]   Residential & Outpatient Substance Abuse Program  [phone]   Psychological Services Organization         Address  Phone  Notes  [facility]al Health  [phone]   [facility]  [phone]   [facility] [address], [place] [phone] or [phone]    Mobile Crisis Teams Organization         Address  Phone  Notes  [facility], Mobile Crisis Care Unit  [phone]   [facility]  [address] [place], [place] [phone]   Doristine Locks [address] [place] [address] [place] [phone]    Self-Help/Support Groups Organization         Address  Phone             Notes  [facility] [place] - variety of support groups  336- I7437963 Call for more information  Narcotics Anonymous (NA), [facility] [address], [employer] [place]  2 meetings at this location   Statistician         Address  Phone  Notes  [facility] [address],    [employer] [place]  [phone]   [facility]  [address], [place] [address], [place], [place] [phone]   [facility] [address] [place], [place] [phone] Admissions: 8am-3pm M-F  [facility] [address]    [place], [place] [phone]   [facility] [address] [place], [place] [phone]   [facility] [address]  [place], [place] [phone]   [facility] - Intensive Outpatient [address] Laurell Josephs [address], [place], [place] [phone]  [facility] ([facility] [address]  [place], [place] [phone] or [phone]   [facility] ([facility]) [address] [place], [place]  [phone] Accepts Medicaid  [facility] [place] [address]  [place] [phone] Substance Abuse/Addiction Treatment   [facility] Behavioral Health Resources Organization         Address  Phone  Notes  [facility]  [phone]) [phone]   Angie FavaJulie Brannon, PhD [address] [place], [place]   [phone](336) [phone] or [phone]) [phone]   [facility]   [address] [place], [place] [phone]) [phone]   [facility] [address], [place], [place] [phone](336) [phone] Insurance/Medicaid/sponsorship through [facility]  [facility] [address]                                    [place], [place] [phone]) [phone] Therapy/tele-psych/case  [facility] [address].   [place], [place] [phone]) [phone]    Dr. Lolly MustacheArfeen  [phone]) [phone]   [facility]  [employer] [facility] 1) [address], [place] 2) [address], [place] 3)  [address], [place] [phone]) [phone](336) [phone]  [phone](336) [phone]   [facility] Child Abuse Hotline [phone](336) [phone] or [phone](336) [phone] (After Hours)

## 2015-05-25 NOTE — ED Notes (Signed)
States he opened a door on 12/31 and felt pain in left shoulder and upper left chest. No other sx.

## 2016-01-14 IMAGING — DX DG CHEST 2V
2 series · 2 of 2 positions shown · non-contrast
Comparison: Chest radiograph 03/24/2013.

CLINICAL DATA: Patient with left-sided chest pain for 2 days.

EXAM:
CHEST  2 VIEW

[chest pa]
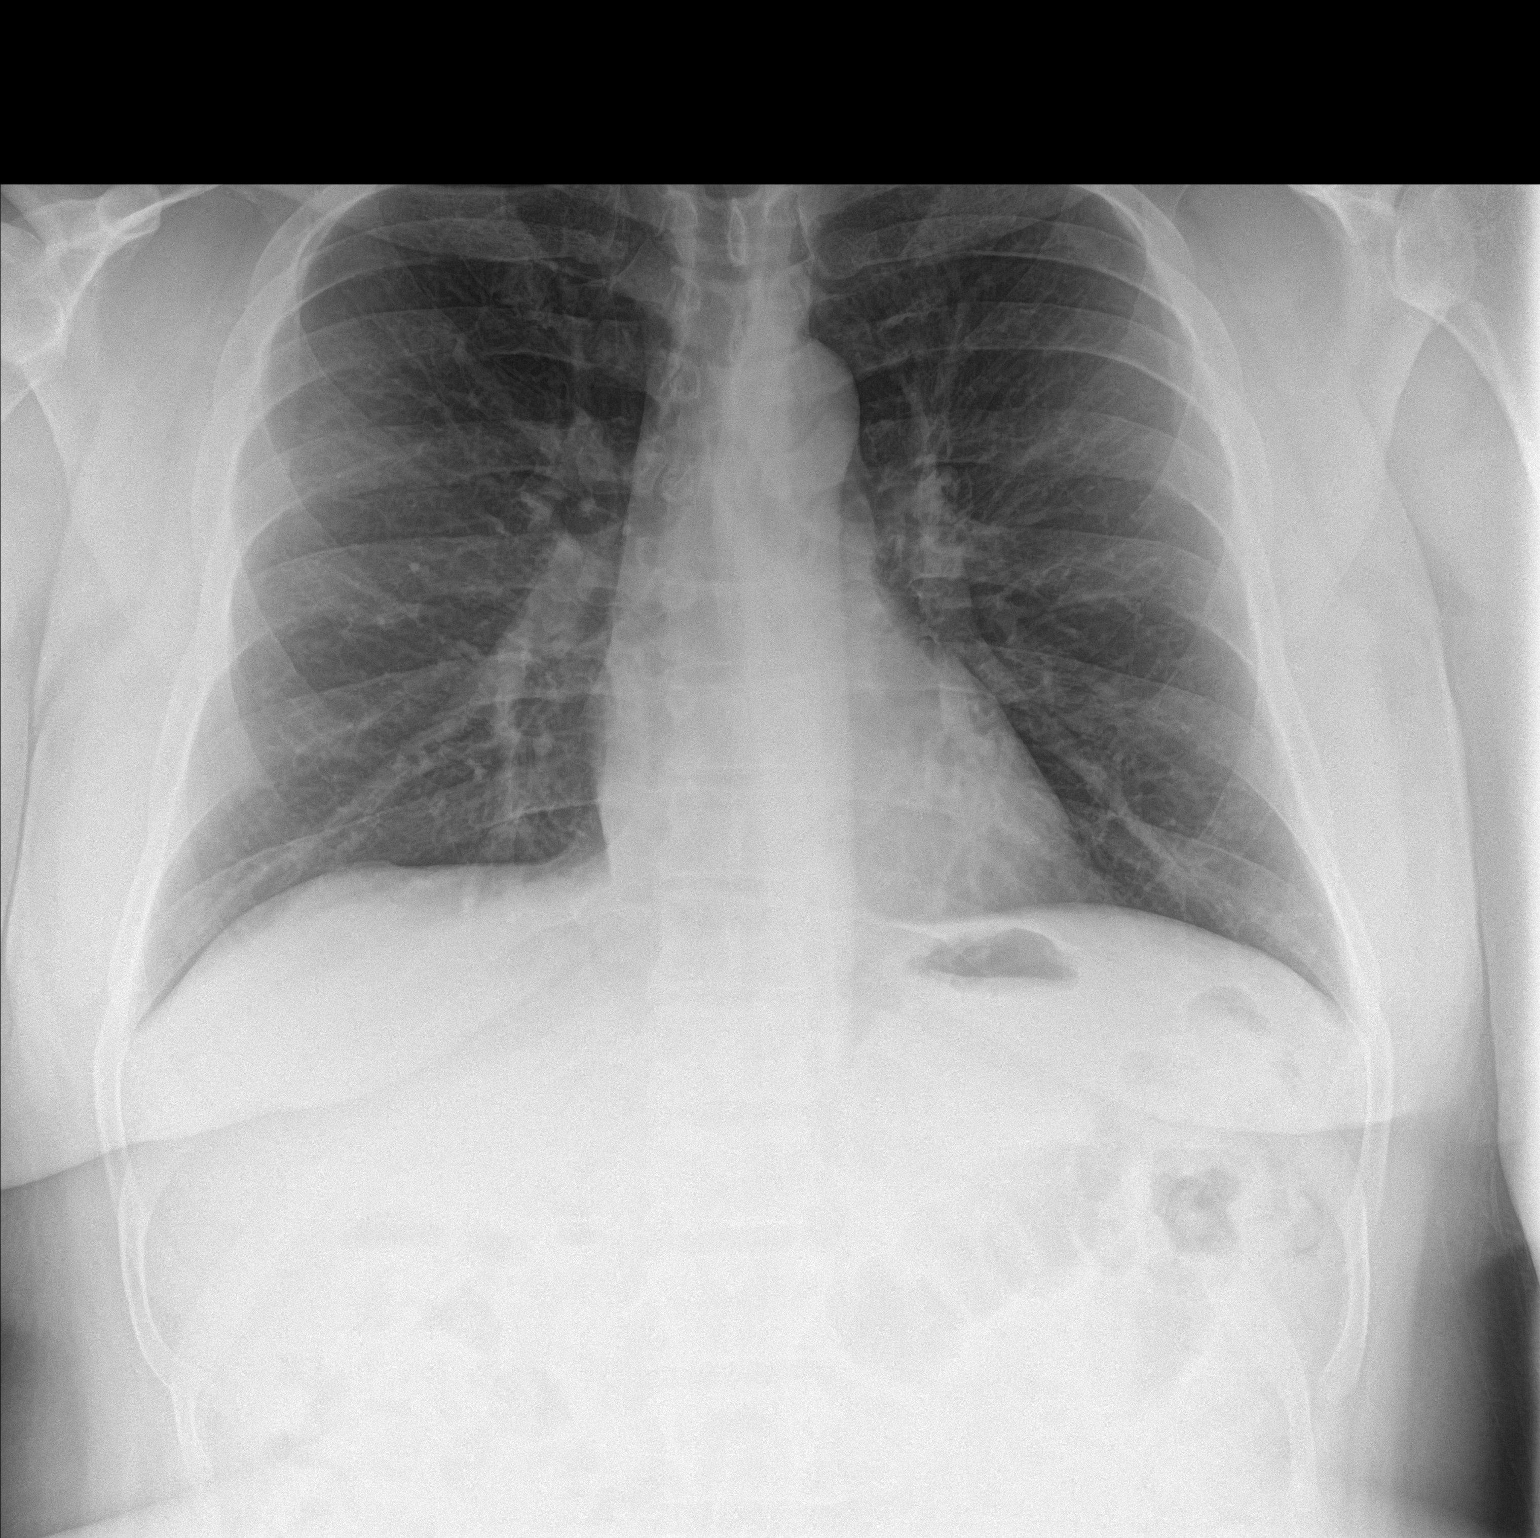

[chest lat]
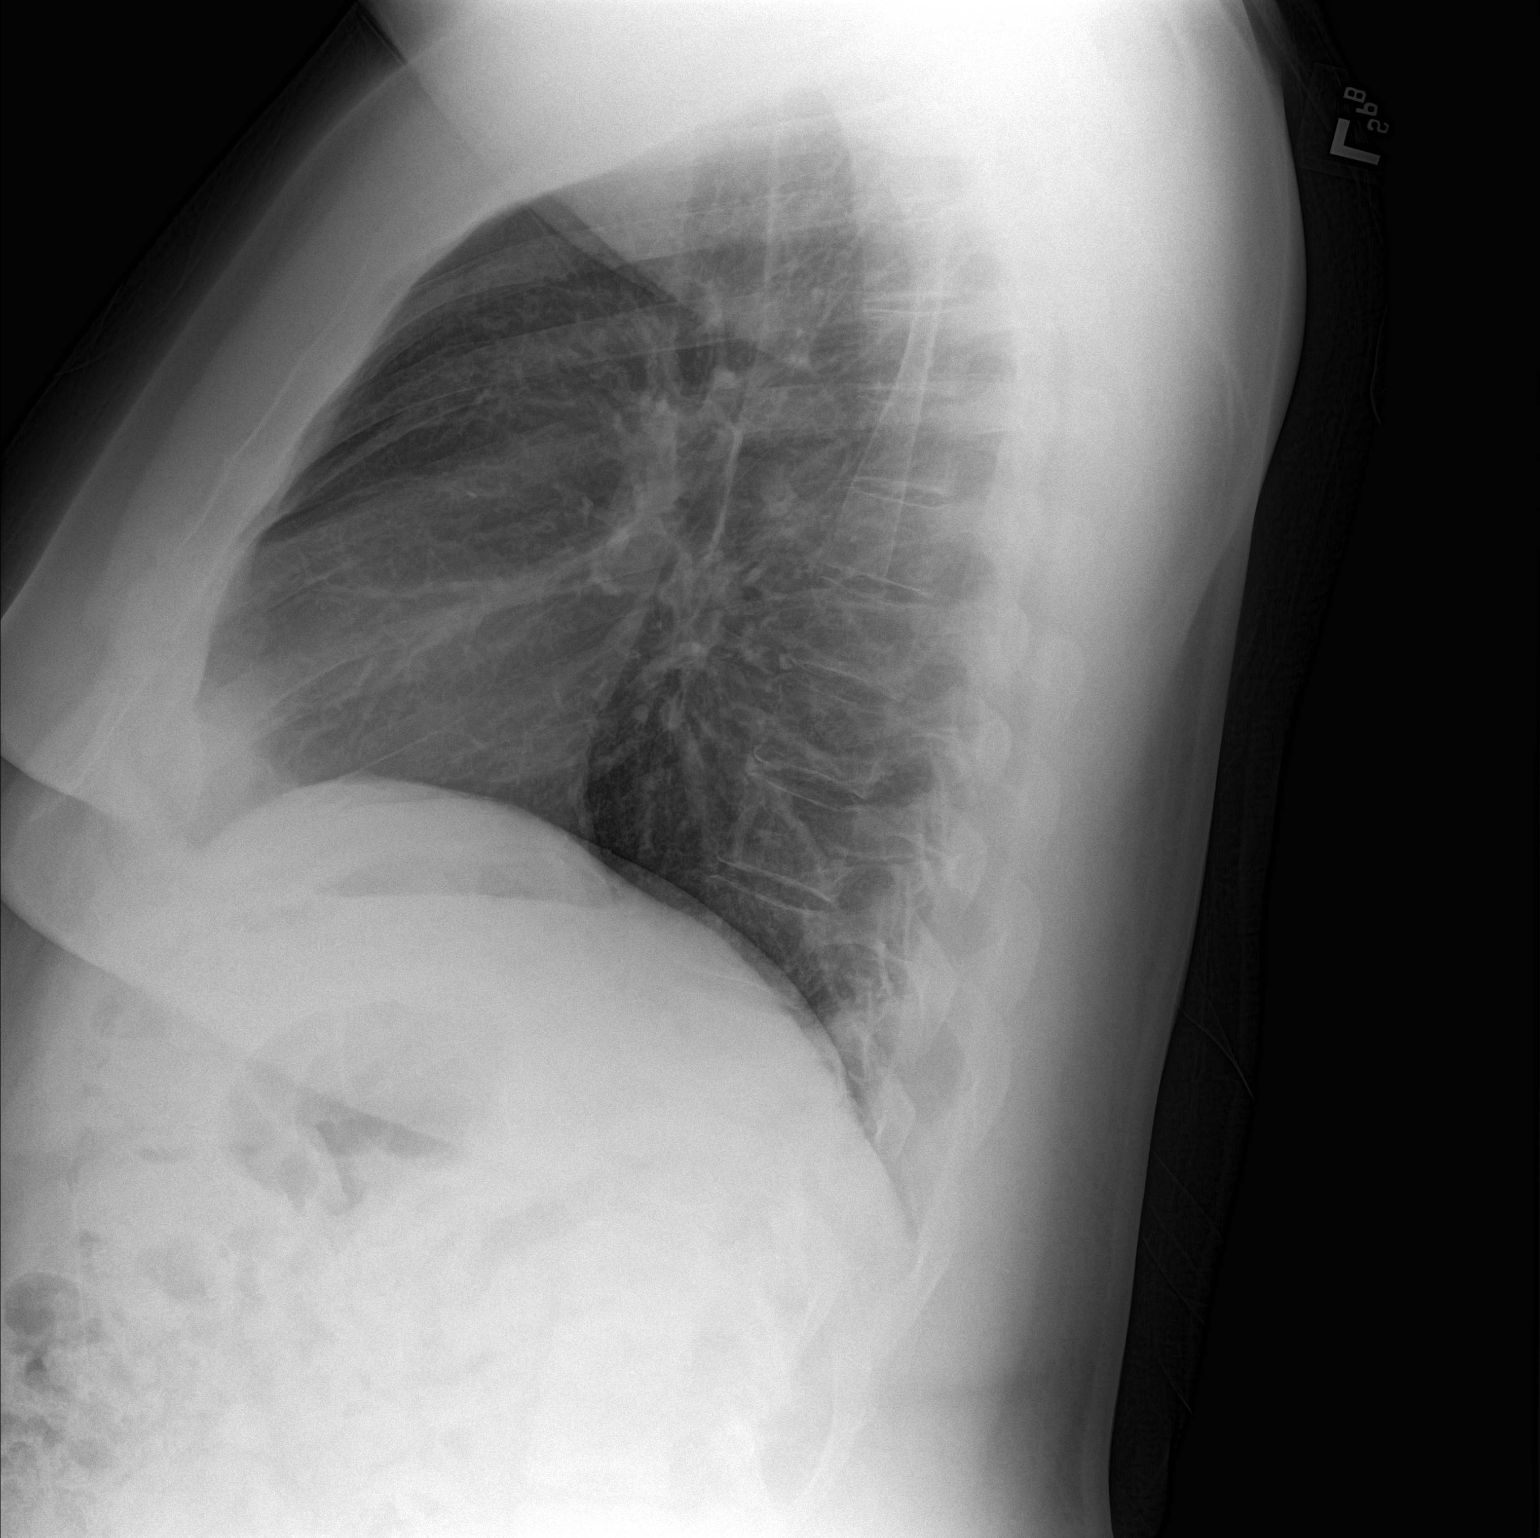

[2 of 2 positions shown; findings below may reference images not displayed]

FINDINGS: Stable cardiac and mediastinal contours. No consolidative pulmonary
opacities. No pleural effusion or pneumothorax. Regional skeleton is
unremarkable.
IMPRESSION: No active cardiopulmonary disease.
